# Patient Record
Sex: Male | Born: 1975 | Race: Black or African American | Hispanic: No | Marital: Married | Smoking: Never smoker
Health system: Southern US, Community
[De-identification: ages and names within clinical notes are randomized; demographics above are authoritative.]

---

## 2002-02-12 ENCOUNTER — Encounter: Payer: Self-pay | Admitting: *Deleted

## 2002-02-12 ENCOUNTER — Emergency Department (HOSPITAL_COMMUNITY): Admission: EM | Admit: 2002-02-12 | Discharge: 2002-02-12 | Payer: Self-pay | Admitting: *Deleted

## 2002-11-18 ENCOUNTER — Emergency Department (HOSPITAL_COMMUNITY): Admission: EM | Admit: 2002-11-18 | Discharge: 2002-11-19 | Payer: Self-pay | Admitting: *Deleted

## 2003-10-14 ENCOUNTER — Emergency Department (HOSPITAL_COMMUNITY): Admission: EM | Admit: 2003-10-14 | Discharge: 2003-10-14 | Payer: Self-pay | Admitting: Emergency Medicine

## 2004-05-15 ENCOUNTER — Emergency Department (HOSPITAL_COMMUNITY): Admission: EM | Admit: 2004-05-15 | Discharge: 2004-05-15 | Payer: Self-pay | Admitting: Emergency Medicine

## 2007-09-04 ENCOUNTER — Emergency Department (HOSPITAL_COMMUNITY): Admission: EM | Admit: 2007-09-04 | Discharge: 2007-09-04 | Payer: Self-pay | Admitting: Family Medicine

## 2010-01-30 ENCOUNTER — Emergency Department (HOSPITAL_COMMUNITY): Admission: EM | Admit: 2010-01-30 | Discharge: 2010-01-30 | Payer: Self-pay | Admitting: Emergency Medicine

## 2011-08-19 ENCOUNTER — Emergency Department (HOSPITAL_COMMUNITY): Payer: 59

## 2011-08-19 ENCOUNTER — Encounter (HOSPITAL_COMMUNITY): Payer: Self-pay | Admitting: Emergency Medicine

## 2011-08-19 ENCOUNTER — Emergency Department (HOSPITAL_COMMUNITY)
Admission: EM | Admit: 2011-08-19 | Discharge: 2011-08-19 | Disposition: A | Discharge status: Home / Self Care | Payer: 59 | Attending: Emergency Medicine | Admitting: Emergency Medicine

## 2011-08-19 DIAGNOSIS — M7989 Other specified soft tissue disorders: Secondary | ICD-10-CM | POA: Insufficient documentation

## 2011-08-19 DIAGNOSIS — M79609 Pain in unspecified limb: Secondary | ICD-10-CM | POA: Insufficient documentation

## 2011-08-19 DIAGNOSIS — X500XXA Overexertion from strenuous movement or load, initial encounter: Secondary | ICD-10-CM | POA: Insufficient documentation

## 2011-08-19 DIAGNOSIS — M25579 Pain in unspecified ankle and joints of unspecified foot: Secondary | ICD-10-CM | POA: Insufficient documentation

## 2011-08-19 DIAGNOSIS — M79673 Pain in unspecified foot: Secondary | ICD-10-CM

## 2011-08-19 DIAGNOSIS — S93409A Sprain of unspecified ligament of unspecified ankle, initial encounter: Secondary | ICD-10-CM | POA: Insufficient documentation

## 2011-08-19 MED ORDER — HYDROCODONE-ACETAMINOPHEN 5-325 MG PO TABS: 2.0000 | ORAL_TABLET | ORAL | Status: AC | PRN

## 2011-08-19 NOTE — Discharge Instructions (Signed)
You were seen and evaluated today for your right foot and ankle pains. Your x-rays show any broken bones. At this time your providers feel your symptoms are caused from ankle sprain and inflammation around your tendons. Continue to use rest, ice, compression and elevation of her symptoms. Use gentle stretching and exercise to help strengthen and stretch your foot. Please followup with her primary care provider.    Ankle Sprain An ankle sprain is an injury to the strong, fibrous tissues (ligaments) that hold the bones of your ankle joint together.  CAUSES Ankle sprain usually is caused by a fall or by twisting your ankle. People who participate in sports are more prone to these types of injuries.  SYMPTOMS  Symptoms of ankle sprain include:  Pain in your ankle. The pain may be present at rest or only when you are trying to stand or walk.   Swelling.   Bruising. Bruising may develop immediately or within 1 to 2 days after your injury.   Difficulty standing or walking.  DIAGNOSIS  Your caregiver will ask you details about your injury and perform a physical exam of your ankle to determine if you have an ankle sprain. During the physical exam, your caregiver will press and squeeze specific areas of your foot and ankle. Your caregiver will try to move your ankle in certain ways. An X-ray exam may be done to be sure a bone was not broken or a ligament did not separate from one of the bones in your ankle (avulsion).  TREATMENT  Certain types of braces can help stabilize your ankle. Your caregiver can make a recommendation for this. Your caregiver may recommend the use of medication for pain. If your sprain is severe, your caregiver may refer you to a surgeon who helps to restore function to parts of your skeletal system (orthopedist) or a physical therapist. HOME CARE INSTRUCTIONS  Apply ice to your injury for 1 to 2 days or as directed by your caregiver. Applying ice helps to reduce inflammation and  pain.  Put ice in a plastic bag.   Place a towel between your skin and the bag.   Leave the ice on for 15 to 20 minutes at a time, every 2 hours while you are awake.   Take over-the-counter or prescription medicines for pain, discomfort, or fever only as directed by your caregiver.   Keep your injured leg elevated, when possible, to lessen swelling.   If your caregiver recommends crutches, use them as instructed. Gradually, put weight on the affected ankle. Continue to use crutches or a cane until you can walk without feeling pain in your ankle.   If you have a plaster splint, wear the splint as directed by your caregiver. Do not rest it on anything harder than a pillow the first 24 hours. Do not put weight on it. Do not get it wet. You may take it off to take a shower or bath.   You may have been given an elastic bandage to wear around your ankle to provide support. If the elastic bandage is too tight (you have numbness or tingling in your foot or your foot becomes cold and blue), adjust the bandage to make it comfortable.   If you have an air splint, you may blow more air into it or let air out to make it more comfortable. You may take your splint off at night and before taking a shower or bath.   Wiggle your toes in the splint several  times per day if you are able.  SEEK MEDICAL CARE IF:   You have an increase in bruising, swelling, or pain.   Your toes feel cold.   Pain relief is not achieved with medication.  SEEK IMMEDIATE MEDICAL CARE IF: Your toes are numb or blue or you have severe pain. MAKE SURE YOU:   Understand these instructions.   Will watch your condition.   Will get help right away if you are not doing well or get worse.  Document Released: 03/09/2005 Document Revised: 02/26/2011 Document Reviewed: 10/12/2007 [facility] Patient Information 2012 [place], [place].    RESOURCE GUIDE  Dental Problems  Patients with Medicaid: [facility] [phone] [address]                                           [phone] [address] Phone:  [phone]                                                  Phone:  [phone]  If unable to pay or uninsured, contact:  [facility] or [facility]. to become qualified for the adult dental clinic.  Chronic Pain Problems Contact [facility]  [phone] Patients need to be referred by their primary care doctor.  Insufficient Money for Medicine Contact [employer]:  call "211" or [facility] 670-074-9230.  No Primary Care Doctor Call [facility]  [phone] Other agencies that provide inexpensive medical care    [facility]  [phone]    [facility]  5417628888    [facility]  [phone]    [facility]  [phone]    [facility]  [phone]    [facility]  [phone]  Psychological Services [facility]  [phone] [facility] Services  [phone] [facility]   [phone] (emergency services 814-390-5570)  Substance Abuse Resources [facility]  817-488-4970 [facility] (458) 470-9452 [facility] [place] 469-375-7581 Floydene Flock [phone] Residential & Outpatient Substance Abuse Program  (202)140-1898  Abuse/Neglect [facility] Child Abuse Hotline 8310482444 [facility] Child Abuse Hotline 440 622 2294 (After Hours)  Emergency Shelter [facility] 514-210-5785  Maternity Homes Room at the [place] [facility] 878-770-5105 [facility] [phone]) [phone]  MRSA Hotline #:   778 721 4058    [facility] [facility]  [facility]     [employer]                          [facility] Dept. [address] [place]                       [address]      [address]  [address]  Sela Hua Phone:  [phone]                                   Phone:  [phone]                 Phone:  [place] Phone:  [place] [phone] (After Hours)

## 2011-08-19 NOTE — ED Provider Notes (Signed)
History     CSN: [id number]  Arrival date & time 08/19/11  1924   First MD Initiated Contact with Patient 08/19/11 2034      Chief Complaint  Patient presents with  . Foot Pain    HPI  History provided by the patient. Patient is a 36 year old male with no significant past medical history who presents with complaints of persistent right foot and ankle pains. Patient states that he was playing basketball one week ago with his 66-year-old son and twisted his ankle. He at that time patient has swelling to his foot and ankle area with tenderness. Patient thought symptoms were just, ankle sprain and has been resting his foot and using over-the-counter pain medications with some relief. Patient was concerned that he still has continued pain after swelling has improved. Pain seems worse when he walks or flexes his foot. Pain is primary located over the lateral aspect of the foot and ankle area. Patient denies any other associated symptoms. Denies any numbness or weakness in foot.      History reviewed. No pertinent past medical history.  History reviewed. No pertinent past surgical history.  History reviewed. No pertinent family history.  History  Substance Use Topics  . Smoking status: Never Smoker   . Smokeless tobacco: Not on file  . Alcohol Use: No      Review of Systems  Musculoskeletal: Positive for joint swelling.  Neurological: Negative for weakness and numbness.    Allergies  Review of patient's allergies indicates no known allergies.  Home Medications   Current Outpatient Rx  Name Route Sig Dispense Refill  . ACETAMINOPHEN 500 MG PO TABS Oral Take 500 mg by mouth every 6 (six) hours as needed. For pain      BP 118/53  Pulse 64  Temp(Src) 98.2 F (36.8 C) (Oral)  Resp 16  SpO2 97%  Physical Exam  Nursing note and vitals reviewed. Constitutional: He is oriented to person, place, and time. He appears well-developed and well-nourished. No distress.  HENT:    Head: Normocephalic.  Cardiovascular: Normal rate and regular rhythm.   Pulmonary/Chest: Effort normal and breath sounds normal.  Musculoskeletal:       Tenderness to palpation over proximal right fifth metatarsal. There is no deformity. No significant swelling. Pain is increased with dorsiflexion of the foot. Normal movement of ankle and toes. No pain over lateral or medial malleolus. Full dorsal pedal pulses.  Neurological: He is alert and oriented to person, place, and time.  Skin: Skin is warm. No erythema.  Psychiatric: He has a normal mood and affect. His behavior is normal.    ED Course  Procedures   Dg Foot Complete Right  08/19/2011  *RADIOLOGY REPORT*  Clinical Data: Basketball injury.  Lateral foot pain.  RIGHT FOOT COMPLETE - 3+ VIEW  Comparison: None.  Findings: No acute bone or soft tissue abnormality is present.  The ankle joint is located.  IMPRESSION: Negative right foot.  Original Report Authenticated By: Jamesetta Orleans. MATTERN, M.D.     1. Foot pain   2. Ankle sprain       MDM  Patient Seen and evaluated. Patient no acute distress.        Angus Seller, [place] 08/19/11 2135

## 2011-08-19 NOTE — ED Notes (Signed)
Patient with right foot pain.  Patient states he twisted it when he was playing basketball with children a week ago.  Patient states the pain is increasing.

## 2011-08-21 NOTE — ED Provider Notes (Signed)
Medical screening examination/treatment/procedure(s) were performed by non-physician practitioner and as supervising physician I was immediately available for consultation/collaboration.   Carleene Cooper III, MD 08/21/11 1120

## 2012-02-05 IMAGING — CR DG HAND COMPLETE 3+V*L*
3 series · 3 of 3 positions shown · non-contrast
Comparison: 

CLINICAL DATA: Injury, pain.

LEFT HAND - COMPLETE 3+ VIEW

[view not recorded (1 of 3)]
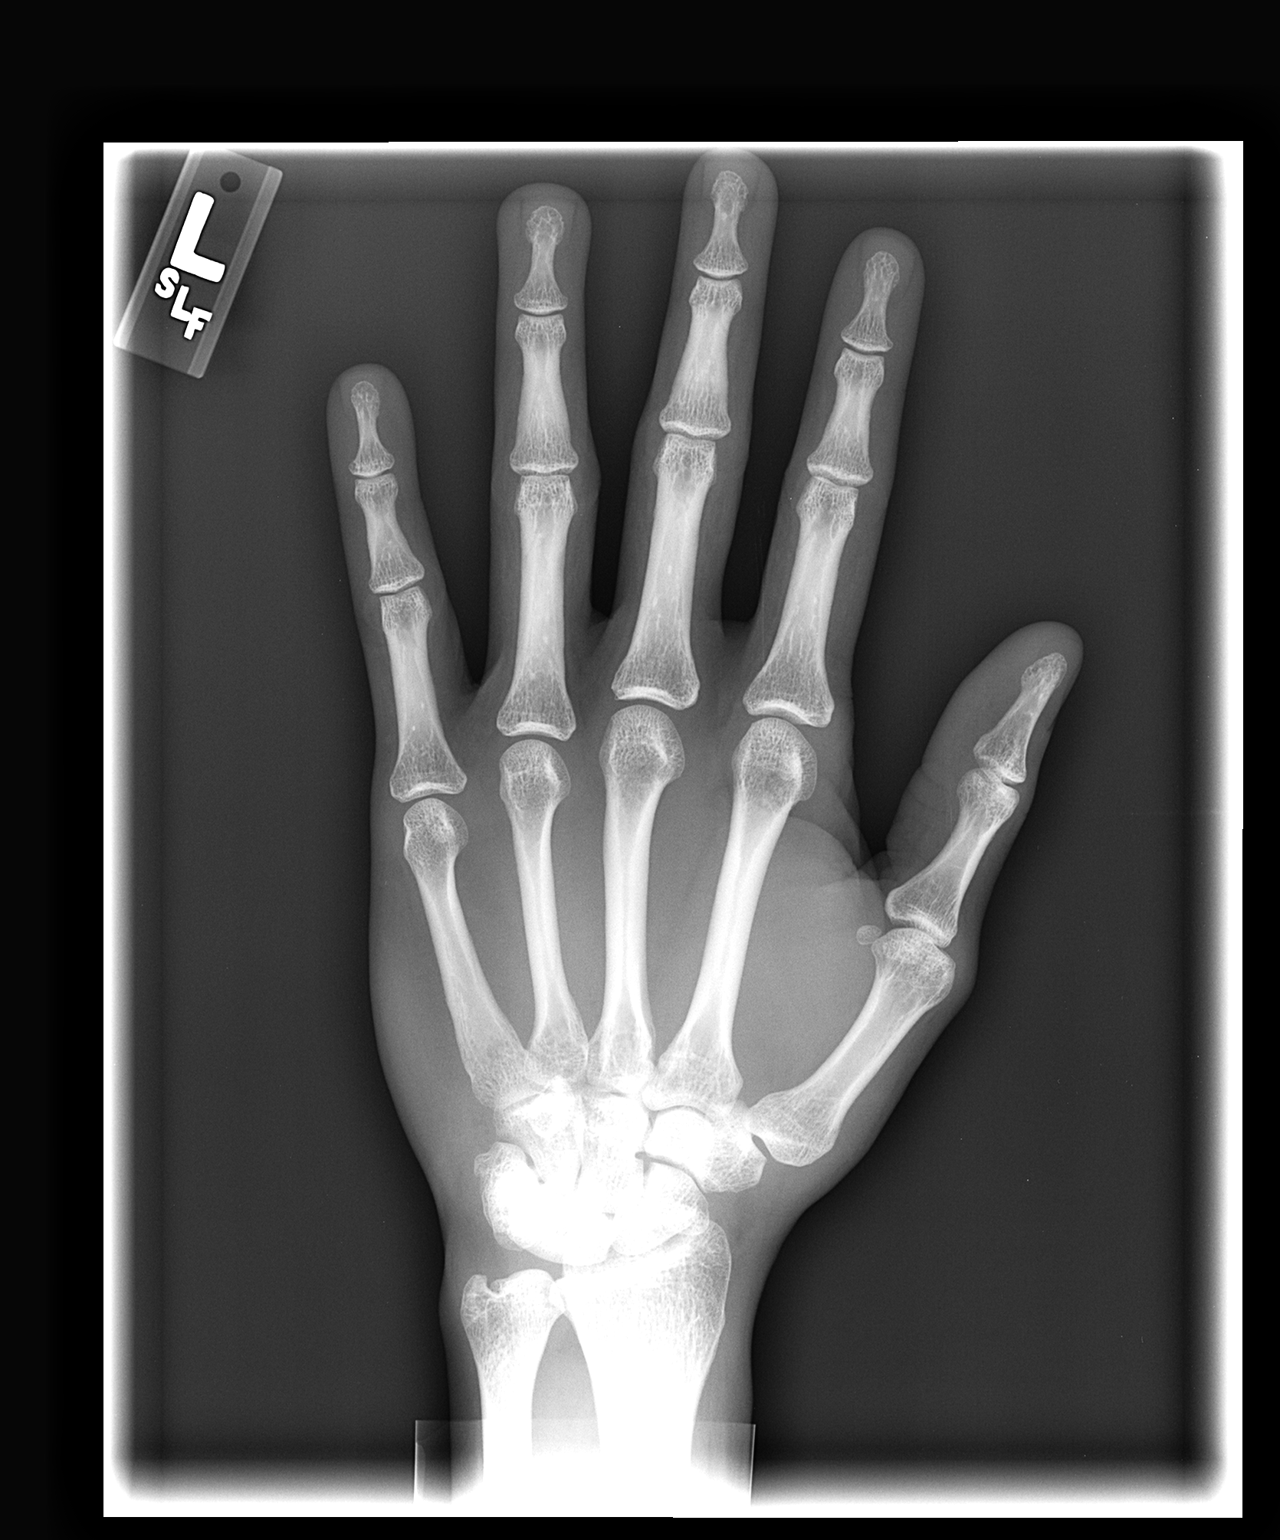

[view not recorded (2 of 3)]
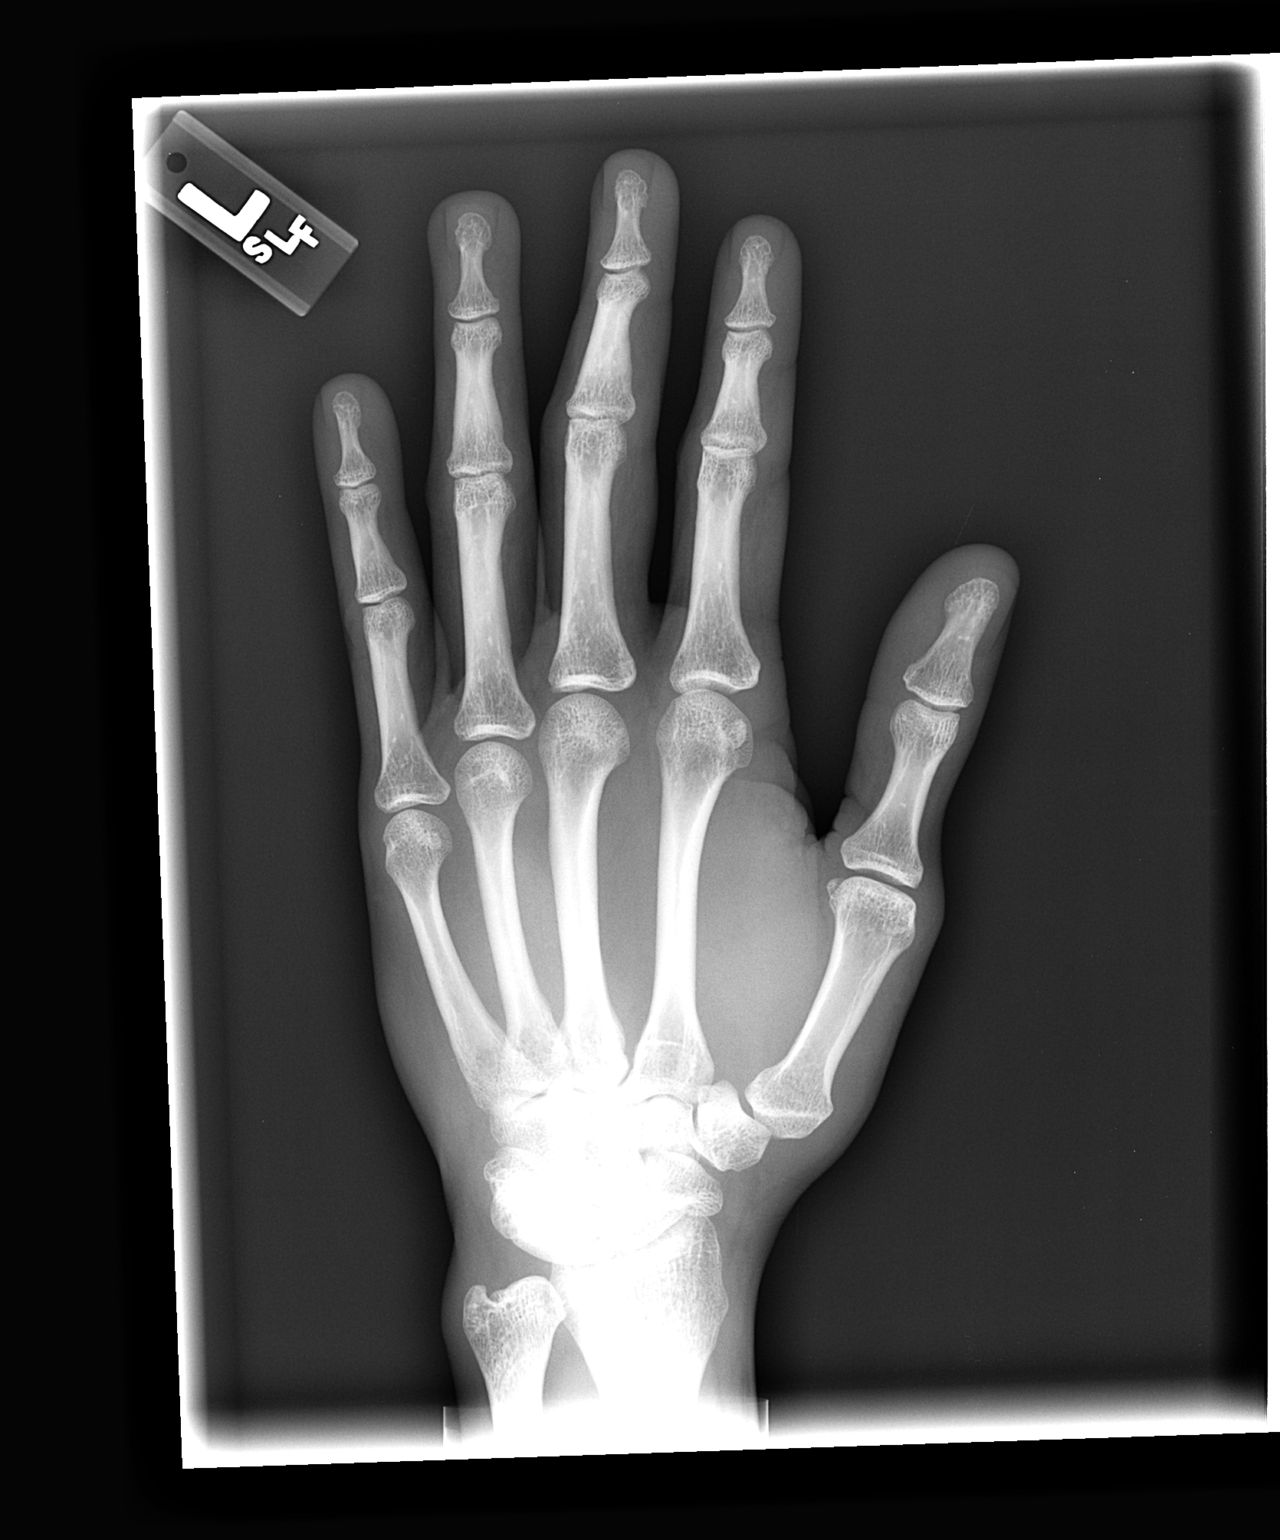

[view not recorded (3 of 3)]
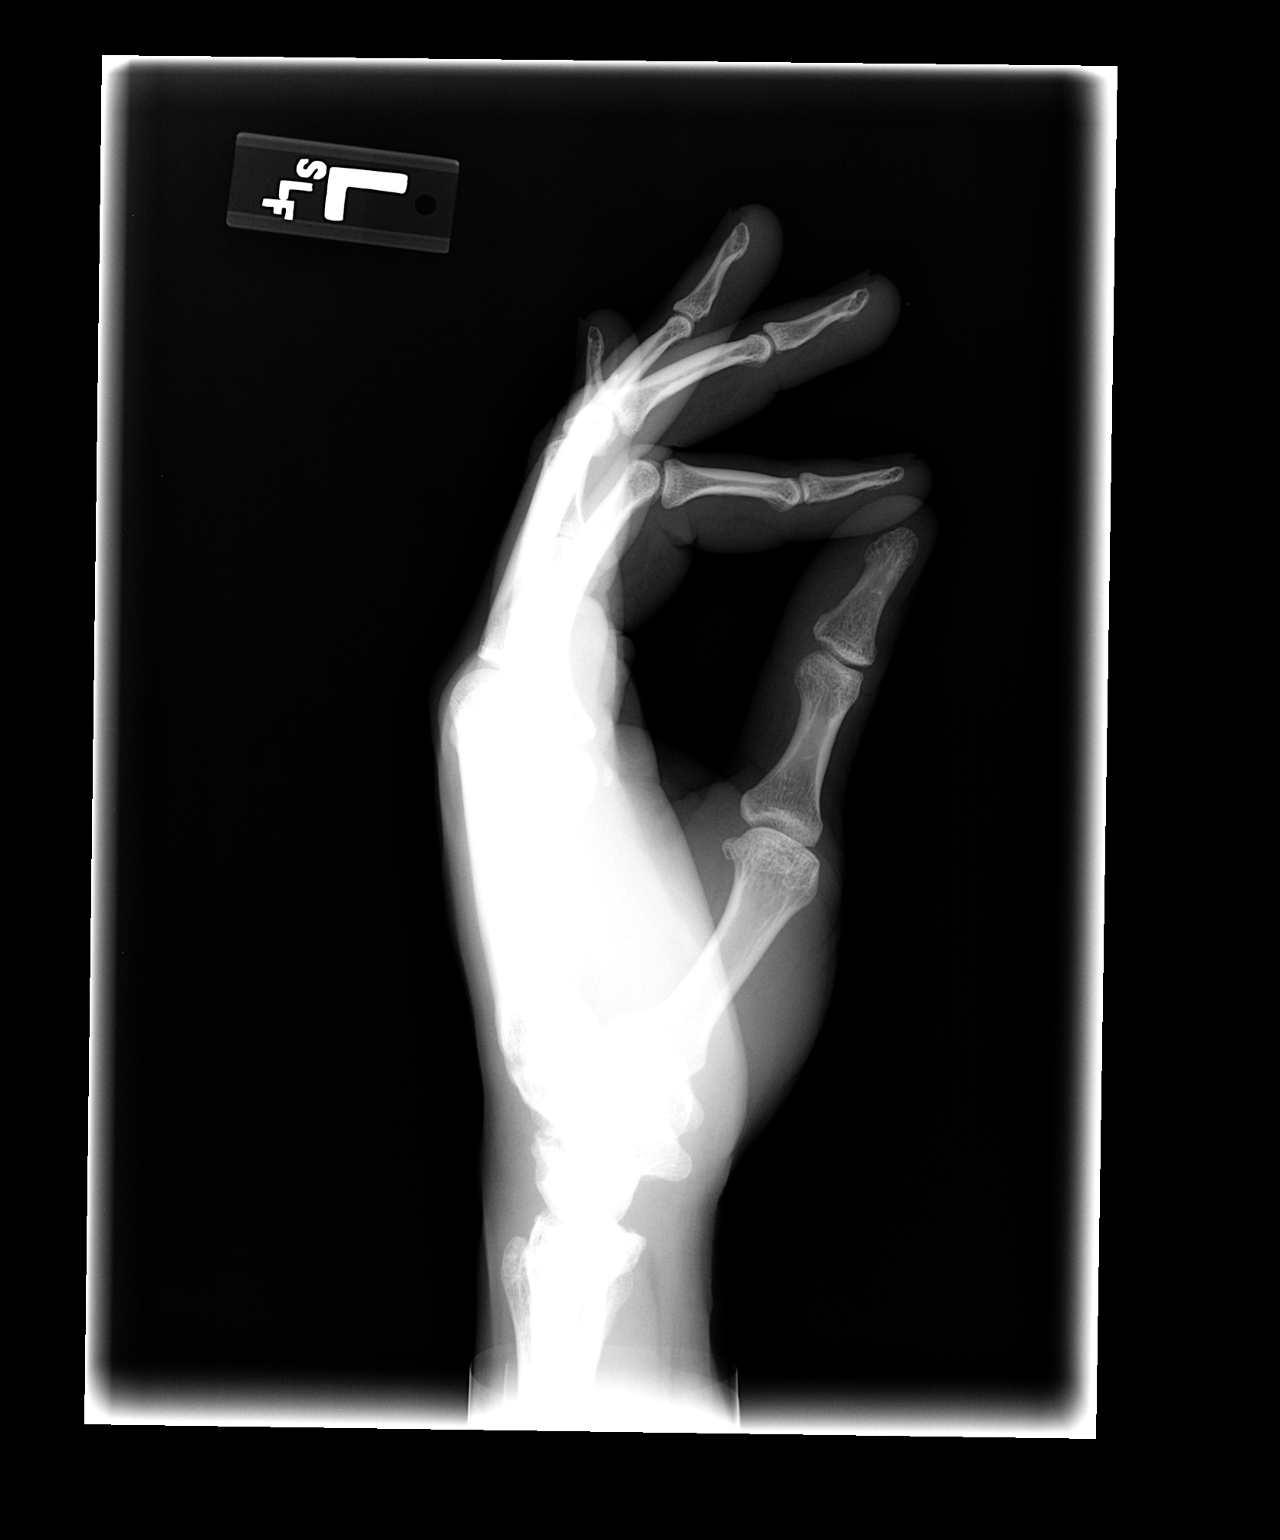

[3 of 3 positions shown; findings below may reference images not displayed]

FINDINGS: No fracture or dislocation is identified.  Soft tissues
unremarkable.  The lunate and triquetrum are not distinctly
visualized which may be due to patient positioning or possibly a
carpal coalition.
IMPRESSION: 1.  No acute finding.
2.  Question coalition of the lunate and triquetrum.

## 2013-07-17 ENCOUNTER — Emergency Department (HOSPITAL_COMMUNITY)
Admission: EM | Admit: 2013-07-17 | Discharge: 2013-07-17 | Disposition: A | Discharge status: Home / Self Care | Payer: 59 | Attending: Emergency Medicine | Admitting: Emergency Medicine

## 2013-07-17 ENCOUNTER — Encounter (HOSPITAL_COMMUNITY): Payer: Self-pay | Admitting: Emergency Medicine

## 2013-07-17 DIAGNOSIS — H10029 Other mucopurulent conjunctivitis, unspecified eye: Secondary | ICD-10-CM

## 2013-07-17 MED ORDER — ERYTHROMYCIN 5 MG/GM OP OINT: TOPICAL_OINTMENT | OPHTHALMIC | Status: AC

## 2013-07-17 NOTE — Discharge Instructions (Signed)
Bacterial Conjunctivitis  Bacterial conjunctivitis, commonly called pink eye, is an inflammation of the clear membrane that covers the white part of the eye (conjunctiva). The inflammation can also happen on the underside of the eyelids. The blood vessels in the conjunctiva become inflamed causing the eye to become red or pink. Bacterial conjunctivitis may spread easily from one eye to another and from person to person (contagious).   CAUSES   Bacterial conjunctivitis is caused by bacteria. The bacteria may come from your own skin, your upper respiratory tract, or from someone else with bacterial conjunctivitis.  SYMPTOMS   The normally white color of the eye or the underside of the eyelid is usually pink or red. The pink eye is usually associated with irritation, tearing, and some sensitivity to light. Bacterial conjunctivitis is often associated with a thick, yellowish discharge from the eye. The discharge may turn into a crust on the eyelids overnight, which causes your eyelids to stick together. If a discharge is present, there may also be some blurred vision in the affected eye.  DIAGNOSIS   Bacterial conjunctivitis is diagnosed by your caregiver through an eye exam and the symptoms that you report. Your caregiver looks for changes in the surface tissues of your eyes, which may point to the specific type of conjunctivitis. A sample of any discharge may be collected on a cotton-tip swab if you have a severe case of conjunctivitis, if your cornea is affected, or if you keep getting repeat infections that do not respond to treatment. The sample will be sent to a lab to see if the inflammation is caused by a bacterial infection and to see if the infection will respond to antibiotic medicines.  TREATMENT   · Bacterial conjunctivitis is treated with antibiotics. Antibiotic eyedrops are most often used. However, antibiotic ointments are also available. Antibiotics pills are sometimes used. Artificial tears or eye  washes may ease discomfort.  HOME CARE INSTRUCTIONS   · To ease discomfort, apply a cool, clean wash cloth to your eye for 10 20 minutes, 3 4 times a day.  · Gently wipe away any drainage from your eye with a warm, wet washcloth or a cotton ball.  · Wash your hands often with soap and water. Use paper towels to dry your hands.  · Do not share towels or wash cloths. This may spread the infection.  · Change or wash your pillow case every day.  · You should not use eye makeup until the infection is gone.  · Do not operate machinery or drive if your vision is blurred.  · Stop using contacts lenses. Ask your caregiver how to sterilize or replace your contacts before using them again. This depends on the type of contact lenses that you use.  · When applying medicine to the infected eye, do not touch the edge of your eyelid with the eyedrop bottle or ointment tube.  SEEK IMMEDIATE MEDICAL CARE IF:   · Your infection has not improved within 3 days after beginning treatment.  · You had yellow discharge from your eye and it returns.  · You have increased eye pain.  · Your eye redness is spreading.  · Your vision becomes blurred.  · You have a fever or persistent symptoms for more than 2 3 days.  · You have a fever and your symptoms suddenly get worse.  · You have facial pain, redness, or swelling.  MAKE SURE YOU:   · Understand these instructions.  · Will watch your   condition.  · Will get help right away if you are not doing well or get worse.  Document Released: 03/09/2005 Document Revised: 12/02/2011 Document Reviewed: 08/10/2011  [employer]® Patient Information ©2014 [employer].

## 2013-07-17 NOTE — ED Notes (Signed)
Visual acuity  Both eyes: 20/13 Right eye: 20/13 Left eye: 20/20

## 2013-07-17 NOTE — ED Provider Notes (Signed)
CSN: [id number]     Arrival date & time 07/17/13  1956 History  This chart was scribed for non-physician practitioner, Marlon Peliffany Maximus Hoffert, PA-C,working with Ethelda ChickMartha K Linker, MD, by Karle PlumberJennifer Tensley, ED Scribe.  This patient was seen in room TR04C/TR04C and the patient's care was started at 9:43 PM.  Chief Complaint  Patient presents with  . Eye Drainage   The history is provided by the patient. No language interpreter was used.   HPI Comments:  Devin Reyes is a 38 y.o. male who presents to the Emergency Department complaining of left eye redness, drainage, and crusting upon waking in the morning that started about four days ago. Pt states his nephew was diagnosed with conjunctivitis recently. He denies fever, HA, vomiting, or facial trauma.   History reviewed. No pertinent past medical history. History reviewed. No pertinent past surgical history. History reviewed. No pertinent family history. History  Substance Use Topics  . Smoking status: Never Smoker   . Smokeless tobacco: Not on file  . Alcohol Use: No    Review of Systems  Constitutional: Negative for fever.  Eyes: Positive for discharge (left eye) and redness (left eye).  Gastrointestinal: Negative for vomiting.  Neurological: Negative for headaches.  All other systems reviewed and are negative.   Allergies  Review of patient's allergies indicates no known allergies.  Home Medications   Prior to Admission medications   Not on File   Triage Vitals: BP 122/62  Pulse 51  Temp(Src) 99.1 F (37.3 C) (Oral)  Resp 16  Ht 6\' 1"  (1.854 m)  Wt 170 lb 6 oz (77.282 kg)  BMI 22.48 kg/m2  SpO2 96% Physical Exam  Nursing note and vitals reviewed. Constitutional: He is oriented to person, place, and time. He appears well-developed and well-nourished.  HENT:  Head: Normocephalic and atraumatic.  Eyes: EOM are normal. Pupils are equal, round, and reactive to light. Left eye exhibits discharge (clear). No foreign body present  in the left eye. Left conjunctiva is injected. Left conjunctiva has no hemorrhage. Left eye exhibits normal extraocular motion.  Neck: Normal range of motion.  Cardiovascular: Normal rate.   Pulmonary/Chest: Effort normal.  Musculoskeletal: Normal range of motion.  Neurological: He is alert and oriented to person, place, and time.  Skin: Skin is warm and dry.  Psychiatric: He has a normal mood and affect. His behavior is normal.    ED Course  Procedures (including critical care time) DIAGNOSTIC STUDIES: Oxygen Saturation is 96% on RA, adequate by my interpretation.   COORDINATION OF CARE: 9:44 PM- Will prescribe Erythromycin ointment. Pt verbalizes understanding and agrees to plan.  Medications - No data to display  Labs Review Labs Reviewed - No data to display  Imaging Review No results found.   EKG Interpretation None      MDM   Final diagnoses:  Pink eye   Normal visual acuity, EOMIs, no trauma or injury. NO pain.   38 y.o.Devin Reyes's evaluation in the Emergency Department is complete. It has been determined that no acute conditions requiring further emergency intervention are present at this time. The patient/guardian have been advised of the diagnosis and plan. We have discussed signs and symptoms that warrant return to the ED, such as changes or worsening in symptoms.  Vital signs are stable at discharge. Filed Vitals:   07/17/13 2158  BP: 116/60  Pulse: 57  Temp:   Resp:     Patient/guardian has voiced understanding and agreed to follow-up with the PCP  or specialist.   I personally performed the services described in this documentation, which was scribed in my presence. The recorded information has been reviewed and is accurate.   Dorthula Matasiffany G Zayvian Mcmurtry, PA-C 07/18/13 2136

## 2013-07-17 NOTE — ED Notes (Signed)
Pt reports left eye redness, drainage, crusty in AM x 4 days. Reports mild blurred vision, but denies blurred vision. PERRLA, 2mm. NAD. AO x4. Neuro intact.

## 2013-07-18 NOTE — ED Provider Notes (Signed)
Medical screening examination/treatment/procedure(s) were performed by non-physician practitioner and as supervising physician I was immediately available for consultation/collaboration.   EKG Interpretation None       Marques Ericson K Linker, MD 07/18/13 2138 

## 2013-08-24 IMAGING — CR DG FOOT COMPLETE 3+V*R*
3 series · 3 of 3 positions shown · non-contrast
Comparison: None.

CLINICAL DATA: Basketball injury.  Lateral foot pain.

RIGHT FOOT COMPLETE - 3+ VIEW

[t foot ap right]
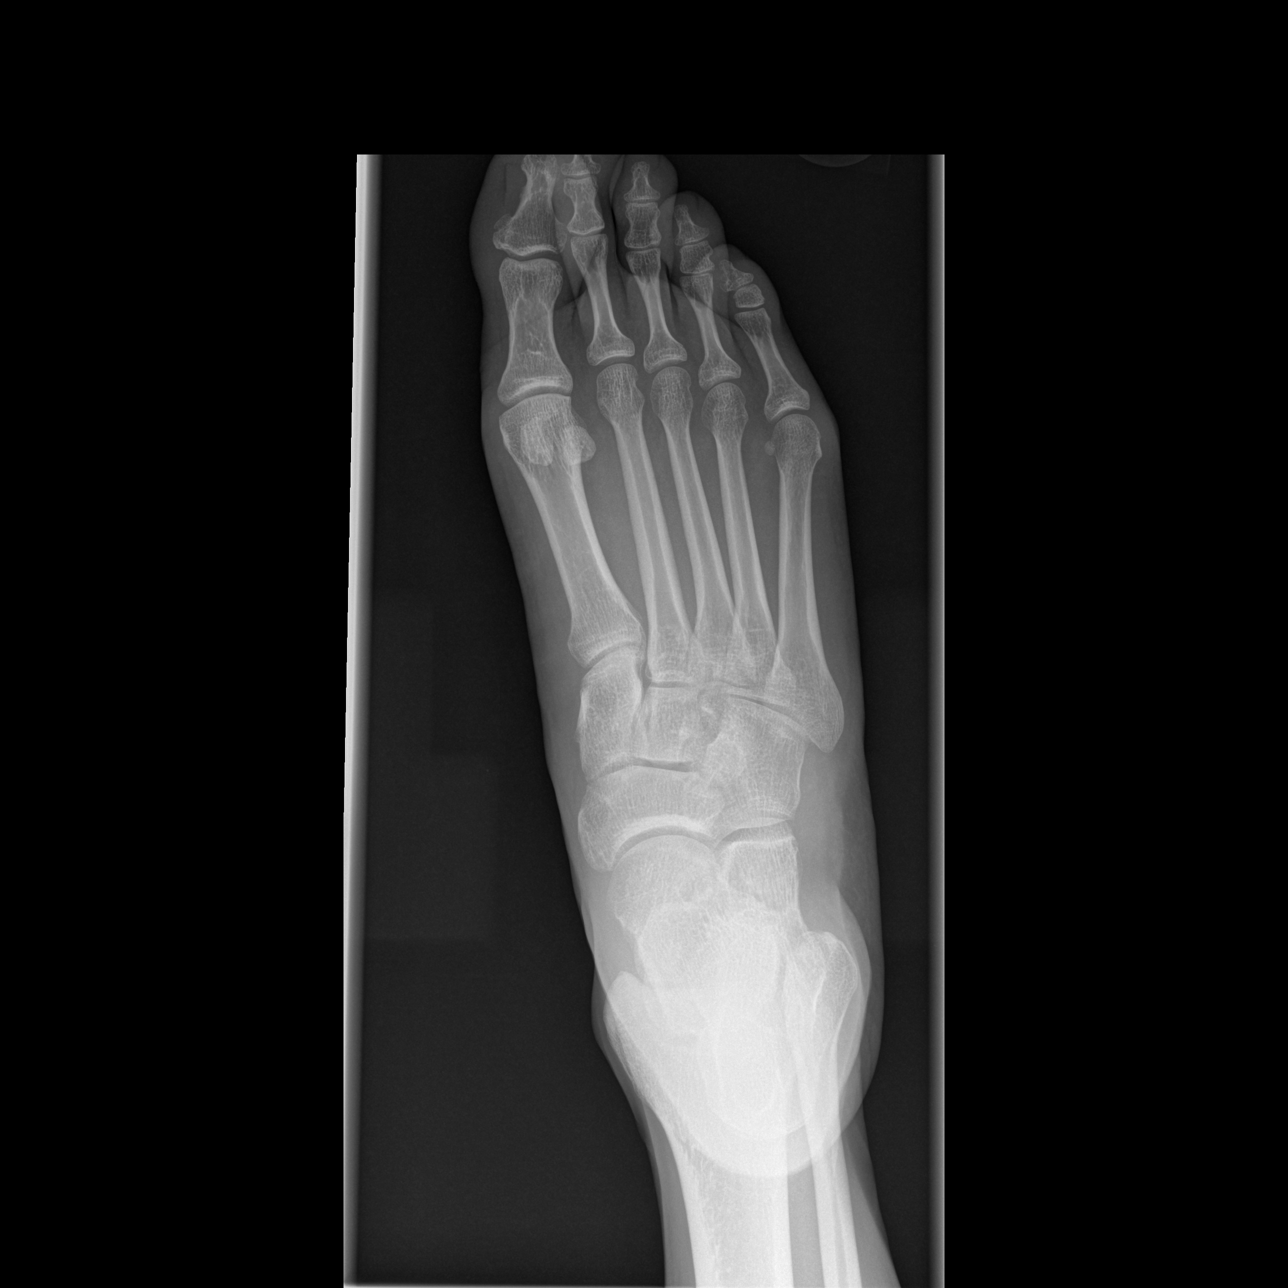

[t foot oblique right]
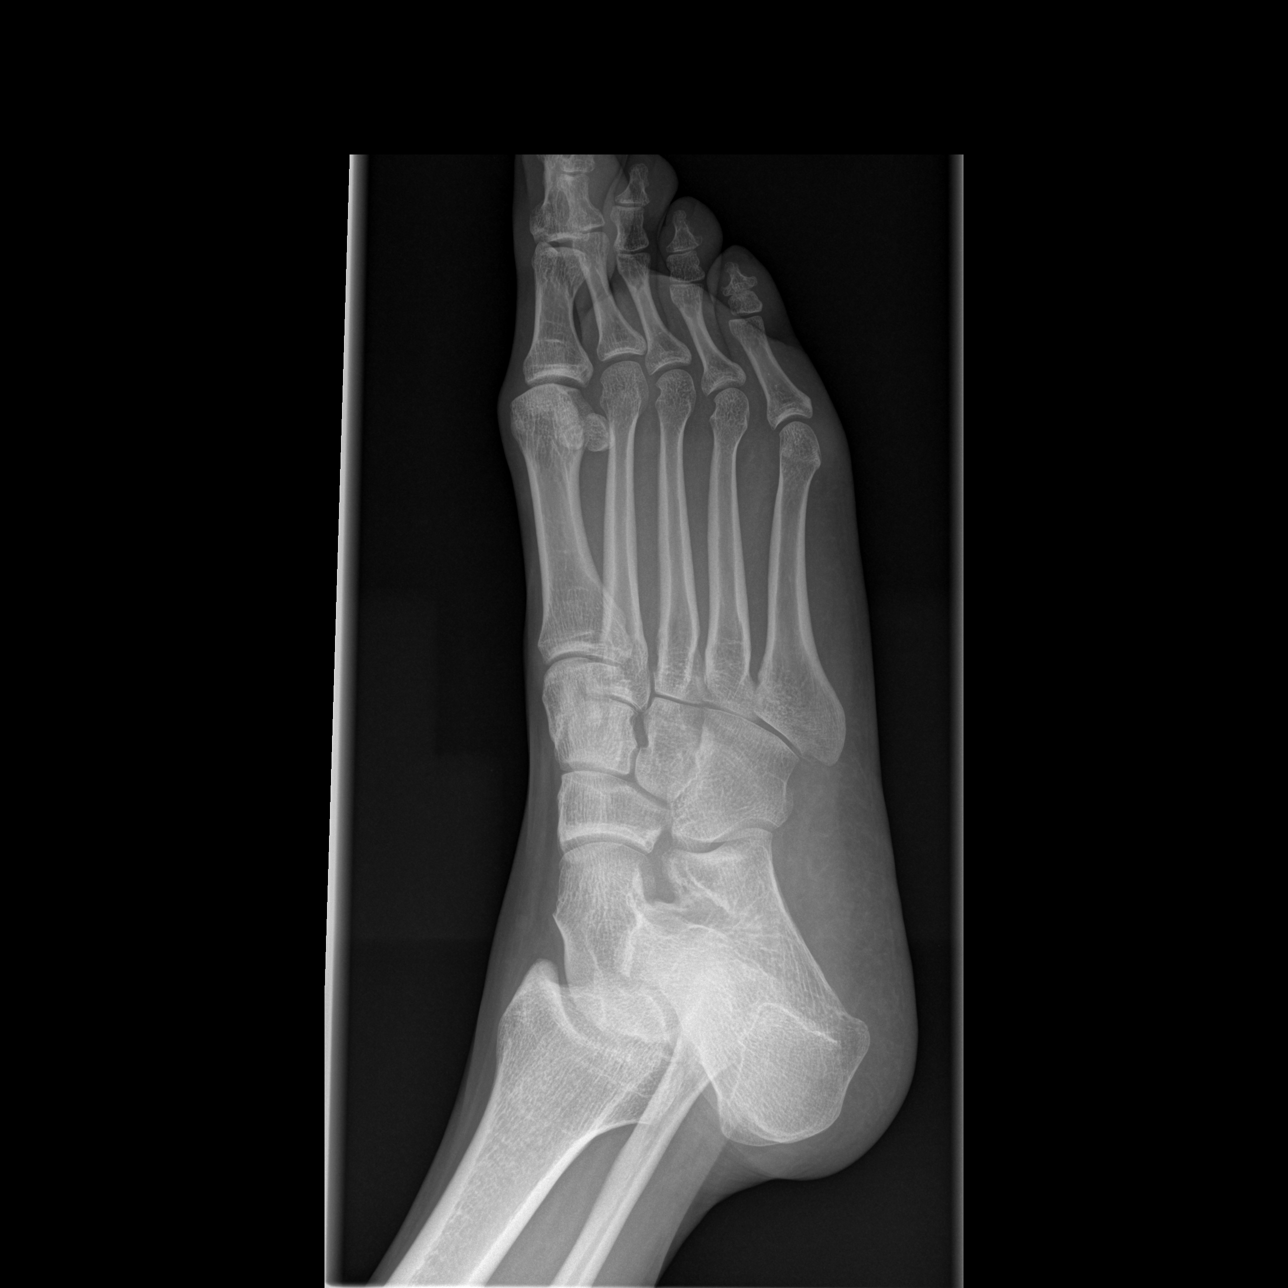

[t foot lat right]
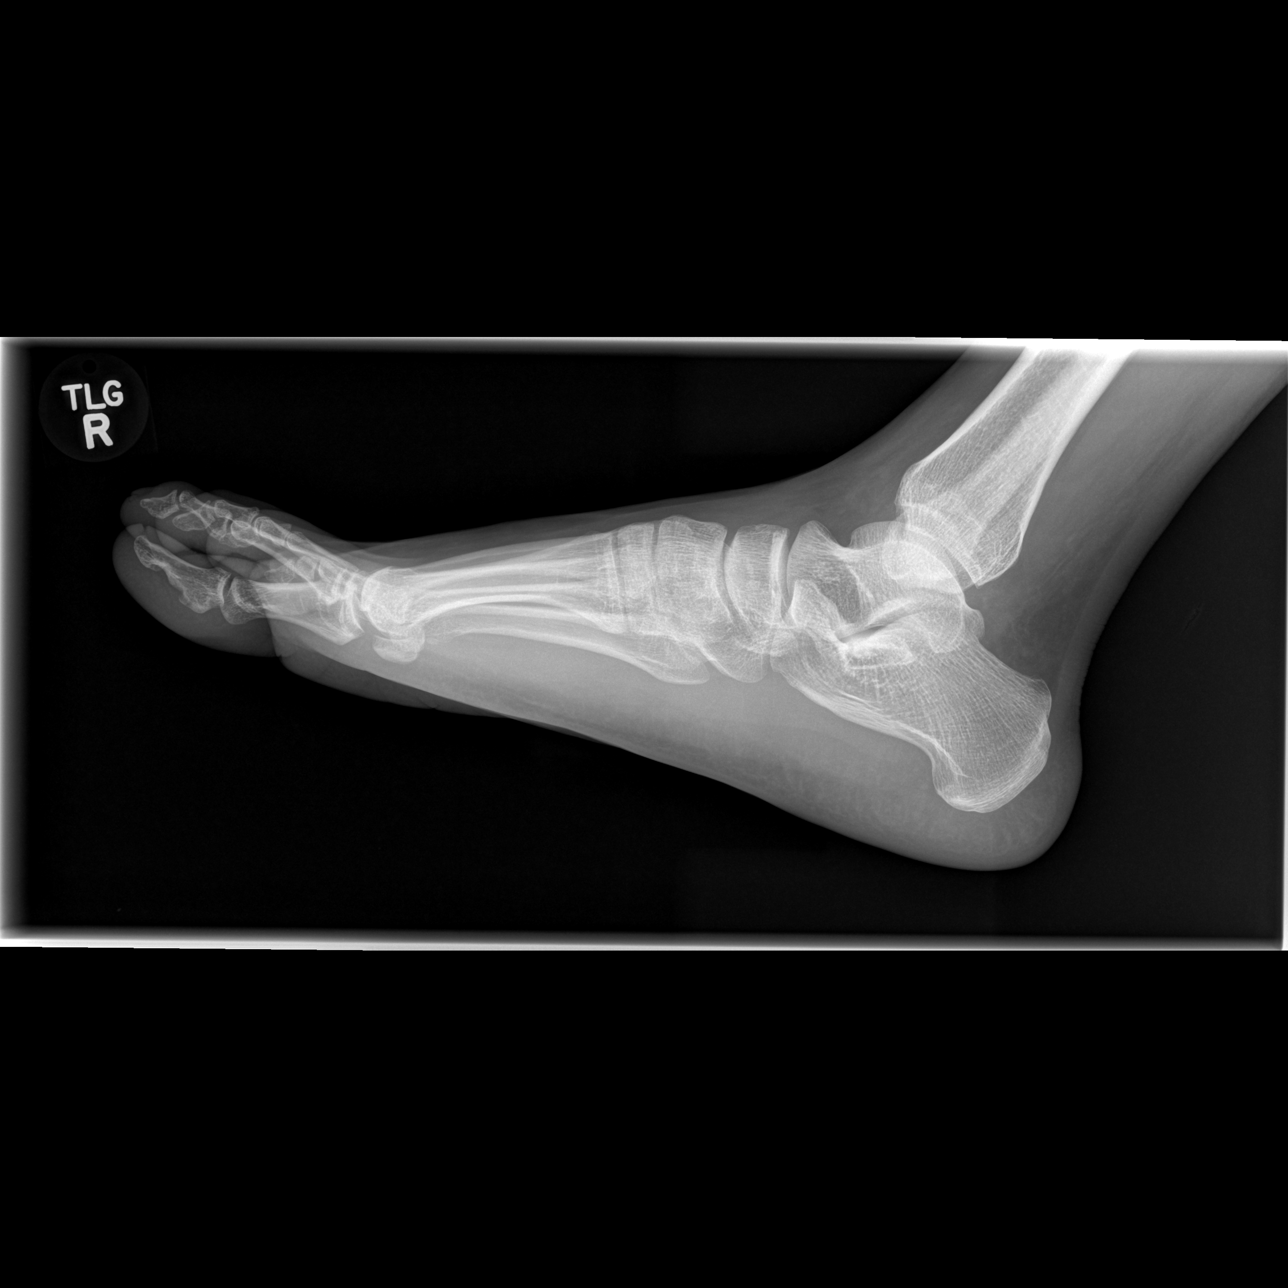

[3 of 3 positions shown; findings below may reference images not displayed]

FINDINGS: No acute bone or soft tissue abnormality is present.  The
ankle joint is located.
IMPRESSION: Negative right foot.

## 2020-05-30 ENCOUNTER — Emergency Department (HOSPITAL_COMMUNITY)
Admission: EM | Admit: 2020-05-30 | Discharge: 2020-05-31 | Disposition: A | Discharge status: Home / Self Care | Payer: Managed Care, Other (non HMO) | Attending: Emergency Medicine | Admitting: Emergency Medicine

## 2020-05-30 ENCOUNTER — Encounter (HOSPITAL_COMMUNITY): Payer: Self-pay | Admitting: Emergency Medicine

## 2020-05-30 ENCOUNTER — Other Ambulatory Visit: Payer: Self-pay

## 2020-05-30 DIAGNOSIS — H6123 Impacted cerumen, bilateral: Secondary | ICD-10-CM | POA: Insufficient documentation

## 2020-05-30 DIAGNOSIS — H938X3 Other specified disorders of ear, bilateral: Secondary | ICD-10-CM | POA: Diagnosis present

## 2020-05-30 MED ORDER — CARBAMIDE PEROXIDE 6.5 % OT SOLN: 5.0000 [drp] | Freq: Two times a day (BID) | OTIC | 0 refills | Status: AC

## 2020-05-30 NOTE — Discharge Instructions (Addendum)
With your impaction of earwax, it is important that you use the prescribed drops, twice daily into both ears.  Return here for concerning changes in your condition.

## 2020-05-30 NOTE — ED Triage Notes (Signed)
C/o fullness to bilateral ears x 2 days.

## 2020-05-30 NOTE — ED Provider Notes (Signed)
[facility] EMERGENCY DEPARTMENT Provider Note   CSN: [id number] Arrival date & time: 05/30/20  1535     History Chief Complaint  Patient presents with  . Ear Pain    Devin Reyes is a 45 y.o. male.  HPI Patient presents with concern of bilateral ear fullness, muffled sound. He notes a history of prior cerumen impaction bilaterally.  This episode began about 3 days ago, and since that time he has had worsening auditory capacity bilaterally, more noticeable on the left. He has not attempted anything for cerumen removal including Q-tips, drops. He notes that he has a small earlobes, and he was previously told that this contributes to wax that does not adequately actually clear.    History reviewed. No pertinent past medical history.  There are no problems to display for this patient.   History reviewed. No pertinent surgical history.     No family history on file.  Social History   Tobacco Use  . Smoking status: Never Smoker  Substance Use Topics  . Alcohol use: No  . Drug use: No    Home Medications Prior to Admission medications   Medication Sig Start Date End Date Taking? Authorizing Provider  erythromycin ophthalmic ointment Place a 1/2 inch ribbon of ointment into the lower eyelid for 7 days Patient not taking: No sig reported 07/17/13   Marlon Pel, PA-C    Allergies    Patient has no known allergies.  Review of Systems   Review of Systems  Constitutional:       Per HPI, otherwise negative  HENT:       Per HPI, otherwise negative  Eyes: Negative.   Respiratory:       Per HPI, otherwise negative  Cardiovascular:       Per HPI, otherwise negative  Gastrointestinal: Negative for vomiting.  Endocrine:       Negative aside from HPI  Genitourinary:       Neg aside from HPI   Musculoskeletal:       Per HPI, otherwise negative  Skin: Negative.   Neurological: Negative for headaches.    Physical Exam Updated Vital  Signs BP 128/82 (BP Location: Left Arm)   Pulse 61   Temp 97.7 F (36.5 C) (Oral)   Resp 14   SpO2 98%   Physical Exam Vitals and nursing note reviewed.  Constitutional:      General: He is not in acute distress.    Appearance: He is well-developed.  HENT:     Head: Normocephalic and atraumatic.     Ears:     Comments: Neither tympanic membrane is visible.  Both ears have substantial cerumen.  No surrounding erythema, no external pain, no other obvious abnormalities. Eyes:     Conjunctiva/sclera: Conjunctivae normal.  Cardiovascular:     Rate and Rhythm: Normal rate and regular rhythm.  Pulmonary:     Effort: Pulmonary effort is normal. No respiratory distress.     Breath sounds: No stridor.  Abdominal:     General: There is no distension.  Skin:    General: Skin is warm and dry.  Neurological:     Mental Status: He is alert and oriented to person, place, and time.     ED Results / Procedures / Treatments    Procedures Procedures   Patient had irrigation of his ear canals performed by nursing staff.  ED Course  I have reviewed the triage vital signs and the nursing notes.  Pertinent labs &  imaging results that were available during my care of the patient were reviewed by me and considered in my medical decision making (see chart for details).  Well-appearing adult male presents with bilateral cerumen impaction.  No evidence for concurrent infection.  Patient had irrigation here was discharged with topical meds to follow-up with ENT as needed and ongoing therapy. No other complaints including headache, requiring advanced imaging.  Final Clinical Impression(s) / ED Diagnoses Final diagnoses:  Bilateral impacted cerumen    Rx / DC Orders ED Discharge Orders    None       Gerhard Munch, MD 05/30/20 2337

## 2024-02-25 ENCOUNTER — Emergency Department (HOSPITAL_BASED_OUTPATIENT_CLINIC_OR_DEPARTMENT_OTHER)
Admission: EM | Admit: 2024-02-25 | Discharge: 2024-02-25 | Discharge status: Absent without leave | Source: Home / Self Care

## 2024-02-25 ENCOUNTER — Other Ambulatory Visit: Payer: Self-pay

## 2024-02-25 ENCOUNTER — Emergency Department (HOSPITAL_BASED_OUTPATIENT_CLINIC_OR_DEPARTMENT_OTHER)
Admission: EM | Admit: 2024-02-25 | Discharge: 2024-02-25 | Disposition: A | Discharge status: Home / Self Care | Attending: Emergency Medicine | Admitting: Emergency Medicine

## 2024-02-25 DIAGNOSIS — M79662 Pain in left lower leg: Secondary | ICD-10-CM | POA: Insufficient documentation

## 2024-02-25 LAB — CBC
HCT: 44 % (ref 39.0–52.0)
Hemoglobin: 14.2 g/dL (ref 13.0–17.0)
MCH: 30.4 pg (ref 26.0–34.0)
MCHC: 32.3 g/dL (ref 30.0–36.0)
MCV: 94.2 fL (ref 80.0–100.0)
Platelets: 254 K/uL (ref 150–400)
RBC: 4.67 MIL/uL (ref 4.22–5.81)
RDW: 11.9 % (ref 11.5–15.5)
WBC: 7.2 K/uL (ref 4.0–10.5)
nRBC: 0 % (ref 0.0–0.2)

## 2024-02-25 LAB — BASIC METABOLIC PANEL WITH GFR
Anion gap: 11 (ref 5–15)
BUN: 17 mg/dL (ref 6–20)
CO2: 28 mmol/L (ref 22–32)
Calcium: 10 mg/dL (ref 8.9–10.3)
Chloride: 101 mmol/L (ref 98–111)
Creatinine, Ser: 1.27 mg/dL — ABNORMAL HIGH (ref 0.61–1.24)
GFR, Estimated: >60 mL/min (ref >60)
Glucose, Bld: 93 mg/dL (ref 70–99)
Potassium: 4.3 mmol/L (ref 3.5–5.1)
Sodium: 140 mmol/L (ref 135–145)

## 2024-02-25 MED ORDER — NAPROXEN 500 MG PO TABS: 500.0000 mg | ORAL_TABLET | Freq: Two times a day (BID) | ORAL | 0 refills | Status: AC

## 2024-02-25 MED ORDER — METHOCARBAMOL 500 MG PO TABS: 500.0000 mg | ORAL_TABLET | Freq: Two times a day (BID) | ORAL | 0 refills | Status: AC

## 2024-02-25 NOTE — Discharge Instructions (Signed)
 It was a pleasure taking care of you here today  As we discussed you need to return tomorrow for your ultrasound at 3 PM.  I wrote you for some less relaxers and anti-inflammatory in case your ultrasound is negative make sure to follow-up with orthopedics your primary care provider  Return for new or worsening symptoms

## 2024-02-25 NOTE — ED Triage Notes (Signed)
 Pt POV reporting L calf tenderness and swelling x1 week, concerned for blood clot. Denies chest pain/ SOB.

## 2024-02-25 NOTE — ED Provider Notes (Signed)
 [place] EMERGENCY DEPARTMENT AT [facility] Provider Note   CSN: [id number] Arrival date & time: 02/25/24  8145    Patient presents with: Leg Pain   Devin Reyes is a 48 y.o. male here for evaluation of left calf pain.  Noted about a week ago he developed some pain to the posterior left calf.  Worse when he dorsiflexes at his ankle.  He denies any known injury or trauma.  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  No history of PE or DVT.  He feels like his left calf is possibly more swollen than the right.  No redness or warmth.  No chest pain, shortness of breath, nausea, vomiting, numbness, weakness.  He has never had anything like this previously.   HPI     Prior to Admission medications   Medication Sig Start Date End Date Taking? Authorizing Provider  methocarbamol  (ROBAXIN ) 500 MG tablet Take 1 tablet (500 mg total) by mouth 2 (two) times daily. 02/25/24  Yes Sherrod Toothman A, PA-C  naproxen  (NAPROSYN ) 500 MG tablet Take 1 tablet (500 mg total) by mouth 2 (two) times daily. 02/25/24  Yes Danialle Dement A, PA-C  carbamide peroxide (DEBROX) 6.5 % OTIC solution Place 5 drops into both ears 2 (two) times daily. 05/30/20   Garrick Charleston, MD  erythromycin  ophthalmic ointment Place a 1/2 inch ribbon of ointment into the lower eyelid for 7 days Patient not taking: No sig reported 07/17/13   Levora Riggs, PA-C    Allergies: Patient has no known allergies.    Review of Systems  Constitutional: Negative.   HENT: Negative.    Respiratory: Negative.    Cardiovascular: Negative.   Gastrointestinal: Negative.   Genitourinary: Negative.   Musculoskeletal:        Left leg pain  Skin: Negative.   Neurological: Negative.   All other systems reviewed and are negative.   Updated Vital Signs BP 125/88   Pulse (!) 55   Temp 97.9 F (36.6 C) (Temporal)   Resp 17   Ht 6' 2 (1.88 m)   Wt 91.2 kg   SpO2 100%   BMI 25.81 kg/m    Physical Exam Vitals and nursing note reviewed.  Constitutional:      General: He is not in acute distress.    Appearance: He is well-developed. He is not ill-appearing, toxic-appearing or diaphoretic.  HENT:     Head: Normocephalic and atraumatic.  Eyes:     Pupils: Pupils are equal, round, and reactive to light.  Cardiovascular:     Rate and Rhythm: Normal rate and regular rhythm.     Pulses: Normal pulses.          Posterior tibial pulses are 2+ on the right side and 2+ on the left side.     Heart sounds: Normal heart sounds.  Pulmonary:     Effort: Pulmonary effort is normal. No respiratory distress.     Breath sounds: Normal breath sounds.  Abdominal:     General: Bowel sounds are normal. There is no distension.     Palpations: Abdomen is soft.     Tenderness: There is no abdominal tenderness. There is no right CVA tenderness, left CVA tenderness or guarding.  Musculoskeletal:        General: Tenderness present. No swelling, deformity or signs of injury. Normal range of motion.     Cervical back: Normal range of motion and neck supple.  Right lower leg: No edema.     Left lower leg: No edema.     Comments: No bony tenderness, compartment soft, full range of motion.  Calf soft bilaterally.  Tenderness left popliteal fossa and mid posterior calf.  Intact Achilles tendon.  Skin:    General: Skin is warm and dry.     Capillary Refill: Capillary refill takes less than 2 seconds.     Comments: No edema, erythema, warmth, fluctuance or induration.  No rashes or lesions on exposed skin  Neurological:     General: No focal deficit present.     Mental Status: He is alert and oriented to person, place, and time.     Cranial Nerves: No cranial nerve deficit.     Sensory: No sensory deficit.     Motor: No weakness.     Gait: Gait normal.     (all labs ordered are listed, but only abnormal results are displayed) Labs Reviewed  BASIC METABOLIC PANEL WITH GFR - Abnormal;  Notable for the following components:      Result Value   Creatinine, Ser 1.27 (*)    All other components within normal limits  CBC    EKG: None  Radiology: No results found.   Procedures    48 year old here for evaluation of 1 week of left calf pain.  Atraumatic.  Here he is neurovascularly intact.  Compartments are soft.  He has no obvious infectious symptoms.  2+ DP, PT pulses denies recent surgery, immobilization or malignancy.  No chest pain or shortness of breath.  Labs personally viewed interpreted:  CBC without leukocytosis Metabolic panel creatinine 1.27-no prior to compare  Patient reassessed.  Ultrasound not available at this time.  We have scheduled his appointment for tomorrow to have his ultrasound performed.  Wrote for some anti-inflammatories and muscle relaxers in the meantime.  Shared decision making for Lovenox, will hold for right now.  Low suspicion for occult fracture, dislocation, bacterial infectious process, necrotizing infection, ischemia, septic joint, gout, hemarthrosis, PAD/claudication  The patient has been appropriately medically screened and/or stabilized in the ED. I have low suspicion for any other emergent medical condition which would require further screening, evaluation or treatment in the ED or require inpatient management.  Patient is hemodynamically stable and in no acute distress.  Patient able to ambulate in department prior to ED.  Evaluation does not show acute pathology that would require ongoing or additional emergent interventions while in the emergency department or further inpatient treatment.  I have discussed the diagnosis with the patient and answered all questions.  Pain is been managed while in the emergency department and patient has no further complaints prior to discharge.  Patient is comfortable with plan discussed in room and is stable for discharge at this time.  I have discussed strict return precautions for returning to the  emergency department.  Patient was encouraged to follow-up with PCP/specialist refer to at discharge.     Medications Ordered in the ED - No data to display                                   Medical Decision Making Amount and/or Complexity of Data Reviewed External Data Reviewed: labs, radiology and notes. Labs: ordered. Decision-making details documented in ED Course. Radiology: ordered. Decision-making details documented in ED Course.  Risk OTC drugs. Prescription drug management. Decision regarding hospitalization. Diagnosis or treatment significantly limited by  social determinants of health.       Final diagnoses:  Pain of left calf    ED Discharge Orders          Ordered    US  Venous Img Upper Uni Left        02/25/24 2118    methocarbamol  (ROBAXIN ) 500 MG tablet  2 times daily        02/25/24 2131    naproxen  (NAPROSYN ) 500 MG tablet  2 times daily        02/25/24 2131               Linde Wilensky A, PA-C 02/25/24 2142    Tonia Chew, MD 02/26/24 0020

## 2024-02-26 ENCOUNTER — Other Ambulatory Visit (HOSPITAL_BASED_OUTPATIENT_CLINIC_OR_DEPARTMENT_OTHER): Payer: Self-pay | Admitting: Physician Assistant

## 2024-02-26 ENCOUNTER — Emergency Department (HOSPITAL_BASED_OUTPATIENT_CLINIC_OR_DEPARTMENT_OTHER): Admit: 2024-02-26 | Discharge: 2024-02-26 | Attending: Emergency Medicine | Admitting: Emergency Medicine

## 2024-02-26 DIAGNOSIS — M79662 Pain in left lower leg: Secondary | ICD-10-CM

## 2024-02-26 DIAGNOSIS — R52 Pain, unspecified: Secondary | ICD-10-CM

## 2024-02-26 NOTE — ED Provider Notes (Signed)
 Patient here for results of an outpatient ultrasound that was ordered during his emergency room visit yesterday.  Patient notified in the waiting room.   Narrative & Impression  CLINICAL DATA:  Leg pain for 1 week.   EXAM: LEFT LOWER EXTREMITY VENOUS DOPPLER ULTRASOUND   TECHNIQUE: Gray-scale sonography with compression, as well as color and duplex ultrasound, were performed to evaluate the deep venous system(s) from the level of the common femoral vein through the popliteal and proximal calf veins.   COMPARISON:  None Available.   FINDINGS: VENOUS   Normal compressibility of the common femoral, superficial femoral, and popliteal veins, as well as the visualized calf veins. Visualized portions of profunda femoral vein and great saphenous vein unremarkable. No filling defects to suggest DVT on grayscale or color Doppler imaging. Doppler waveforms show normal direction of venous flow, normal respiratory plasticity and response to augmentation.   Limited views of the contralateral common femoral vein are unremarkable.   OTHER   None.   Limitations: none   IMPRESSION: Negative.     Electronically Signed   By: Leita Birmingham M.D.   On: 02/26/2024 15:46     Theotis Cameron HERO, PA-C 02/26/24 1638    Ruthe Cornet, DO 02/26/24 1757
